# Patient Record
Sex: Male | Born: 1975 | Race: Black or African American | Hispanic: No | Marital: Single | Smoking: Former smoker
Health system: Southern US, Community
[De-identification: ages and names within clinical notes are randomized; demographics above are authoritative.]

## PROBLEM LIST (undated history)

## (undated) DIAGNOSIS — N200 Calculus of kidney: Secondary | ICD-10-CM

---

## 2008-07-21 ENCOUNTER — Emergency Department (HOSPITAL_COMMUNITY): Admission: EM | Admit: 2008-07-21 | Discharge: 2008-07-21 | Payer: Self-pay | Admitting: *Deleted

## 2009-03-11 ENCOUNTER — Ambulatory Visit: Payer: Self-pay | Admitting: Family Medicine

## 2009-03-11 DIAGNOSIS — IMO0002 Reserved for concepts with insufficient information to code with codable children: Secondary | ICD-10-CM | POA: Insufficient documentation

## 2009-03-11 DIAGNOSIS — J309 Allergic rhinitis, unspecified: Secondary | ICD-10-CM | POA: Insufficient documentation

## 2009-03-11 LAB — CONVERTED CEMR LAB
ALT: 22 units/L (ref 0–53)
AST: 23 units/L (ref 0–37)
CO2: 28 meq/L (ref 19–32)
Calcium: 9.7 mg/dL (ref 8.4–10.5)
Chloride: 103 meq/L (ref 96–112)
Cholesterol: 180 mg/dL (ref 0–200)
Lymphocytes Relative: 27 % (ref 12–46)
Lymphs Abs: 1.7 10*3/uL (ref 0.7–4.0)
Monocytes Relative: 8 % (ref 3–12)
Neutro Abs: 3.8 10*3/uL (ref 1.7–7.7)
Neutrophils Relative %: 63 % (ref 43–77)
RBC: 4.66 M/uL (ref 4.22–5.81)
Sodium: 142 meq/L (ref 135–145)
Total Protein: 7.6 g/dL (ref 6.0–8.3)
VLDL: 38 mg/dL (ref 0–40)
Vit D, 25-Hydroxy: 17 ng/mL — ABNORMAL LOW (ref 30–89)
WBC: 6.1 10*3/uL (ref 4.0–10.5)

## 2009-03-14 ENCOUNTER — Telehealth (INDEPENDENT_AMBULATORY_CARE_PROVIDER_SITE_OTHER): Payer: Self-pay | Admitting: Family Medicine

## 2009-03-31 ENCOUNTER — Encounter (INDEPENDENT_AMBULATORY_CARE_PROVIDER_SITE_OTHER): Payer: Self-pay | Admitting: Family Medicine

## 2009-04-15 ENCOUNTER — Encounter: Payer: Self-pay | Admitting: Physician Assistant

## 2009-04-15 ENCOUNTER — Ambulatory Visit: Payer: Self-pay | Admitting: Family Medicine

## 2009-04-15 DIAGNOSIS — E559 Vitamin D deficiency, unspecified: Secondary | ICD-10-CM | POA: Insufficient documentation

## 2009-04-15 DIAGNOSIS — R944 Abnormal results of kidney function studies: Secondary | ICD-10-CM

## 2009-04-15 DIAGNOSIS — Z87442 Personal history of urinary calculi: Secondary | ICD-10-CM

## 2009-04-15 DIAGNOSIS — R809 Proteinuria, unspecified: Secondary | ICD-10-CM

## 2009-04-15 DIAGNOSIS — R319 Hematuria, unspecified: Secondary | ICD-10-CM

## 2009-04-15 LAB — CONVERTED CEMR LAB
BUN: 12 mg/dL (ref 6–23)
Chloride: 103 meq/L (ref 96–112)
Creatinine, Ser: 1.04 mg/dL (ref 0.40–1.50)
Crystals: NONE SEEN
Glucose, Bld: 91 mg/dL (ref 70–99)
RBC / HPF: NONE SEEN (ref <3)
Squamous Epithelial / LPF: NONE SEEN /lpf
Uric Acid, Serum: 6.5 mg/dL (ref 4.0–7.8)

## 2009-04-17 ENCOUNTER — Encounter (INDEPENDENT_AMBULATORY_CARE_PROVIDER_SITE_OTHER): Payer: Self-pay | Admitting: Family Medicine

## 2009-04-17 ENCOUNTER — Ambulatory Visit (HOSPITAL_COMMUNITY): Admission: RE | Admit: 2009-04-17 | Discharge: 2009-04-17 | Payer: Self-pay | Admitting: Family Medicine

## 2009-05-09 ENCOUNTER — Encounter: Payer: Self-pay | Admitting: Physician Assistant

## 2009-06-11 ENCOUNTER — Encounter (INDEPENDENT_AMBULATORY_CARE_PROVIDER_SITE_OTHER): Payer: Self-pay | Admitting: Family Medicine

## 2009-06-11 ENCOUNTER — Ambulatory Visit: Payer: Self-pay | Admitting: Family Medicine

## 2009-06-11 LAB — CONVERTED CEMR LAB
CO2: 28 meq/L (ref 19–32)
Chloride: 101 meq/L (ref 96–112)
Creatinine, Ser: 0.99 mg/dL (ref 0.40–1.50)
Potassium: 4.3 meq/L (ref 3.5–5.3)
Sed Rate: 5 mm/hr (ref 0–16)
Sodium: 139 meq/L (ref 135–145)
TSH: 0.991 microintl units/mL (ref 0.350–4.500)

## 2009-10-10 ENCOUNTER — Ambulatory Visit: Payer: Self-pay | Admitting: Internal Medicine

## 2010-05-26 NOTE — Letter (Signed)
Summary: REFERRAL//RADIOLOGY//APPT DATE & TIME  REFERRAL//RADIOLOGY//APPT DATE & TIME   Imported By: Arta Bruce 05/23/2009 12:49:22  _____________________________________________________________________  External Attachment:    Type:   Image     Comment:   External Document

## 2010-05-26 NOTE — Miscellaneous (Signed)
Summary: PTH Intact Interpretation  PTH-Intact to Total Calcium Ratio:  NORMAL  Clinical Lists Changes

## 2010-12-01 ENCOUNTER — Observation Stay (HOSPITAL_COMMUNITY)
Admission: EM | Admit: 2010-12-01 | Discharge: 2010-12-01 | Disposition: A | Discharge status: Home / Self Care | Payer: Self-pay | Attending: Urology | Admitting: Urology

## 2010-12-01 ENCOUNTER — Emergency Department (HOSPITAL_COMMUNITY): Payer: Self-pay

## 2010-12-01 ENCOUNTER — Emergency Department (HOSPITAL_COMMUNITY)
Admission: EM | Admit: 2010-12-01 | Discharge: 2010-12-01 | Disposition: A | Discharge status: Nursing Home (Includes ICF) | Payer: Self-pay | Attending: Emergency Medicine | Admitting: Emergency Medicine

## 2010-12-01 DIAGNOSIS — N201 Calculus of ureter: Principal | ICD-10-CM | POA: Insufficient documentation

## 2010-12-01 DIAGNOSIS — R112 Nausea with vomiting, unspecified: Secondary | ICD-10-CM | POA: Insufficient documentation

## 2010-12-01 DIAGNOSIS — R109 Unspecified abdominal pain: Secondary | ICD-10-CM | POA: Insufficient documentation

## 2010-12-01 DIAGNOSIS — N200 Calculus of kidney: Secondary | ICD-10-CM | POA: Insufficient documentation

## 2010-12-01 DIAGNOSIS — F17200 Nicotine dependence, unspecified, uncomplicated: Secondary | ICD-10-CM | POA: Insufficient documentation

## 2010-12-01 LAB — DIFFERENTIAL
Basophils Absolute: 0 10*3/uL (ref 0.0–0.1)
Eosinophils Absolute: 0.1 10*3/uL (ref 0.0–0.7)
Eosinophils Relative: 1 % (ref 0–5)
Lymphocytes Relative: 13 % (ref 12–46)
Monocytes Absolute: 0.7 10*3/uL (ref 0.1–1.0)

## 2010-12-01 LAB — URINALYSIS, ROUTINE W REFLEX MICROSCOPIC
Glucose, UA: NEGATIVE mg/dL
Nitrite: NEGATIVE
Specific Gravity, Urine: 1.022 (ref 1.005–1.030)
pH: 5.5 (ref 5.0–8.0)

## 2010-12-01 LAB — CBC
HCT: 40.7 % (ref 39.0–52.0)
MCHC: 35.1 g/dL (ref 30.0–36.0)
MCV: 85 fL (ref 78.0–100.0)
Platelets: 185 10*3/uL (ref 150–400)
RDW: 12.2 % (ref 11.5–15.5)
WBC: 13.7 10*3/uL — ABNORMAL HIGH (ref 4.0–10.5)

## 2010-12-01 LAB — POCT I-STAT, CHEM 8
Calcium, Ion: 1.14 mmol/L (ref 1.12–1.32)
Creatinine, Ser: 1.4 mg/dL — ABNORMAL HIGH (ref 0.50–1.35)
Glucose, Bld: 104 mg/dL — ABNORMAL HIGH (ref 70–99)
HCT: 44 % (ref 39.0–52.0)
Hemoglobin: 15 g/dL (ref 13.0–17.0)

## 2010-12-01 LAB — URINE MICROSCOPIC-ADD ON

## 2010-12-02 NOTE — H&P (Signed)
  Randall Bean, Randall Bean             ACCOUNT NO.:  [id number]  MEDICAL RECORD NO.:  [id number]  LOCATION:                                 FACILITY:  PHYSICIAN:  Excell Seltzer. Annabell Howells, M.D.    DATE OF BIRTH:  01/22/1976  DATE OF ADMISSION: DATE OF DISCHARGE:                             HISTORY & PHYSICAL   CHIEF COMPLAINT:  Left flank pain.  HISTORY:  This is 35 year old Randall Bean's male who presented to the emergency room last night with a 6-hour history of left flank pain with nausea, vomiting, and dark urine.  He has had a prior history of stones. A CT in the emergency room revealed bilateral ureteral stones with an 8- mm left proximal stone with obstruction.  Also, there was a 3-mm stone below that and there was a 5-mm proximal stone on the right without obstruction.  Additionally, there were small bilateral renal stones. His pain has required multiple infusions of Dilaudid to control.  PAST HISTORY:  Pertinent for no drug allergies.CURRENT MEDICATIONS:  None.  MEDICAL HISTORY:  Pertinent for stones.  SURGICAL HISTORY:  Unremarkable.  SOCIAL HISTORY:  He smokes tobacco, using hookah.  He denies alcohol. He works as a Electrical engineer.  FAMILY HISTORY:  Pertinent for heart disease.  REVIEW OF SYSTEMS:  He denies voiding complaints.  He denies fever and chills.  He is otherwise entirely without complaints on a full 16-point review of systems.  PHYSICAL EXAMINATION:  VITAL SIGNS:  Temperature is 98.2, pulse 68, respirations 24, blood pressure is 143/96. GENERAL:  He is a well-developed, well-nourished male in no acute distress.  Alert and oriented x3. HEAD AND FACE: Normocephalic and atraumatic. NECK: Supple without mass. LUNGS: Clear to auscultation. HEART:  Regular rate and rhythm. ABDOMEN: Soft, flat with left CVA tenderness.  No mass, hepatosplenomegaly, or hernias noted.  He has no inguinal adenopathy. EXTREMITIES:  Have full range of motion without edema. SKIN:  Warm and  dry. GU: Exam deferred to cysto.  ACCESSORY CLINICAL INFORMATION:  CT films and report were reviewed.  His urine had 7-10 white cells, too numerous to count red cells.  His creatinine is 1.4.  White count was 13.7 with left shift.  IMPRESSION:  Bilateral ureteral stones with left obstruction with an 8- mm stone, it is unlikely to pass and bilateral renal stones.  PLAN:  At this point, I believe cystoscopy with possible ureteroscopy with insertion of bilateral ureteral stents is indicated.  He will likely need a lithotripsy at least of the left proximal stone if ureteroscopy is not successful.  I have reviewed the risk in detail including bleeding, infection, ureteral injury, need for secondary procedures, need for stents and stent discomfort, need for stent removal at a later date, thrombotic events and anesthetic complications.  He is agreeable to proceed.     Excell Seltzer. Annabell Howells, M.D.     JJW/MEDQ  D:  12/01/2010  T:  12/01/2010  Job:  [id number]  Electronically Signed by Bjorn Pippin M.D. on 12/02/2010 10:29:48 AM

## 2010-12-08 NOTE — Op Note (Signed)
Randall Bean, OSHITA NO.:  [id number]  MEDICAL RECORD NO.:  [id number]  LOCATION:  1444                         FACILITY:  [facility]  PHYSICIAN:  Natalia Leatherwood, MD    DATE OF BIRTH:  02-21-1976  DATE OF PROCEDURE:  12/01/2010 DATE OF DISCHARGE:  12/01/2010                              OPERATIVE REPORT   PREOPERATIVE DIAGNOSES:  Bilateral urolithiasis and nephrolithiasis.  POSTOPERATIVE DIAGNOSES:  Bilateral urolithiasis and nephrolithiasis.  PROCEDURE PERFORMED:  Cystoscopy, bilateral ureteral stent placement.  FINDINGS:  Bilateral ureteral stones.  No bladder lesions.  Particulate materal in urine from an unobstructed urinary system bilaterally.  HISTORY OF PRESENT ILLNESS:  A pleasant 34 year old gentleman who presented to the emergency department with pain.  A CT scan showed bilateral ureteral stones that appeared to be obstructing.  The patient was counseled on options as well as the risks and benefits of the procedure.  The patient wished to proceed.  PROCEDURE:  After informed consent was obtained, the patient was taken to the operating room, was placed in supine position, IV antibiotics transfusion, general anesthesia was induced.  He was then placed in dorsal lithotomy position where all pertinent neurovascular pressure points were padded appropriately.  His genitals were then prepared and draped in usual sterile fashion.  Following this, a 30-degree cystoscope was advanced through the urethra and into the bladder.  The bladder was evaluated in systematic fashion.  There was no lesions noted in the bladder. Attention was then turned to the left ureteral orifice.  It was cannulated with a sensor-tip wire under fluoroscopy.  Once the wire was curled in the left renal pelvis, there was a large amount of debris and particulate matter emanating from around the wire that had been obstructed by the stone.  Given this, I felt that it was more appropriate  to place a stent to allow the debris to pass and come back at another time to operate on the stone.  Therefore, a double-J 6 x 28 ureteral stent was placed over the wire with a fluoroscopy.  There was a good curl in the left renal pelvis and in the bladder.  The strings were not left in place on the stent.  Attention was then turned to the right ureteral orifice.  This was cannulated with a sensor-tip wire, which was placed easily under fluoroscopy into the right renal pelvis.  Again, there was particulate matter emanating from around the stone once the wire was placed.  I felt that operating further could cause the patient to become septic.  Therefore, I elected to place a ureteral stent.  I placed a 6 x 28 double-J ureteral stent over the wire with a good curl noted in the renal pelvis and no strings on the stent.  The bladder was then drained and 10 mL of lidocaine gel was placed into the urethra and a B and O suppository was given to the patient.  He was placed back in supine position.  Anesthesia was reversed and he was taken to PACU in stable condition.  If the patient tolerates the procedure well, he can be discharged home today.  He will be given antibiotics to cover for the instrumentation.  I will have him return to see me to schedule his ureteroscopy in the future.          ______________________________ Natalia Leatherwood, MD     DW/MEDQ  D:  12/01/2010  T:  12/02/2010  Job:  [id number]  Electronically Signed by Natalia Leatherwood MD on 12/08/2010 06:19:52 PM

## 2010-12-08 NOTE — Discharge Summary (Signed)
  NAMEAMRIT, CRESS NO.:  [id number]  MEDICAL RECORD NO.:  [id number]  LOCATION:                                 FACILITY:  PHYSICIAN:  Natalia Leatherwood, MD    DATE OF BIRTH:  12/28/1975  DATE OF ADMISSION:  11/30/2010 DATE OF DISCHARGE:  12/01/2010                              DISCHARGE SUMMARY   DISCHARGE PHYSICIAN:  Natalia Leatherwood, MD, [facility].  ADMISSION DIAGNOSIS:  Bilateral urolithiasis.  DISCHARGE DIAGNOSIS:  Bilateral urolithiasis.  PROCEDURES PERFORMED:  On December 01, 2010, the patient had a cystoscopy with bilateral ureteral stent placement.  HISTORY OF PRESENT ILLNESS:  This pleasant patient presented for bilateral obstructing urolithiasis.  He was admitted to the hospital for pain control and IV hydration.  HOSPITAL COURSE:  The patient was admitted from the Emergency Department.  He was given his treatment options regarding the obstructing stones.  He elected for attempted ureteroscopy and stent placement.  He went to the operating room on August 7th.  Because of return of heavy sediment concerning for purulent drainage after placing guidewires up to the bilateral kidneys it was felt that further manipulation of stones would result in sepsis, therefore ureteral stents were placed bilaterally.  The patient was admitted back to the floor and tolerated the procedure well.  He did not spike any fevers following the surgery.  He felt that he could be discharged to home, we agreed.  DISCHARGE DISPOSITION:  To home.  DISCHARGE CONDITION:  Stable.  DISCHARGE DIET:  Regular.  DISCHARGE MEDICATIONS:  He was given ciprofloxacin 500 mg p.o. b.i.d. for 3 days.  He was also given oxycodone/acetaminophen 5/325 mg tablet to take 1-2 tablet every 4 hours as needed for pain.  FOLLOWUP:  He will be contacted by [facility] for followup in 2-3 weeks for definitive ureteroscopy.  He was given discharge instructions regarding not driving  while taking narcotic pain medications, not operating heavy machinery during that time.  Obvious questions were answered.          ______________________________ Natalia Leatherwood, MD     DW/MEDQ  D:  12/01/2010  T:  12/02/2010  Job:  [id number]  Electronically Signed by Natalia Leatherwood MD on 12/08/2010 06:16:41 PM

## 2010-12-17 ENCOUNTER — Other Ambulatory Visit: Payer: Self-pay | Admitting: Urology

## 2010-12-17 ENCOUNTER — Encounter (HOSPITAL_COMMUNITY): Payer: Self-pay

## 2010-12-17 LAB — CBC
HCT: 40.7 % (ref 39.0–52.0)
MCHC: 33.9 g/dL (ref 30.0–36.0)
MCV: 87.5 fL (ref 78.0–100.0)
Platelets: 193 10*3/uL (ref 150–400)
RDW: 12.7 % (ref 11.5–15.5)
WBC: 6.4 10*3/uL (ref 4.0–10.5)

## 2010-12-18 LAB — URINE CULTURE: Colony Count: 8000

## 2010-12-23 ENCOUNTER — Ambulatory Visit (HOSPITAL_COMMUNITY)
Admission: RE | Admit: 2010-12-23 | Discharge: 2010-12-23 | Disposition: A | Discharge status: Home / Self Care | Payer: Self-pay | Source: Ambulatory Visit | Attending: Urology | Admitting: Urology

## 2010-12-23 DIAGNOSIS — N200 Calculus of kidney: Secondary | ICD-10-CM | POA: Insufficient documentation

## 2010-12-23 DIAGNOSIS — N201 Calculus of ureter: Secondary | ICD-10-CM | POA: Insufficient documentation

## 2010-12-29 NOTE — Op Note (Signed)
Randall Bean, OCH NO.:  [id number]  MEDICAL RECORD NO.:  [id number]  LOCATION:  DAYL                         FACILITY:  [facility]  PHYSICIAN:  Natalia Leatherwood, MD    DATE OF BIRTH:  June 20, 1975  DATE OF PROCEDURE:  12/23/2010 DATE OF DISCHARGE:  12/23/2010                              OPERATIVE REPORT   OPERATING PHYSICIAN:  Natalia Leatherwood, MD  PREOPERATIVE DIAGNOSIS:  Bilateral urolithiasis.  POSTOPERATIVE DIAGNOSIS:  Bilateral urolithiasis.  PROCEDURE PERFORMED:  Cystoscopy, bilateral ureteroscopy, bilateral laser lithotripsy, bilateral ureteral stent exchange.  COMPLICATIONS:  None.  ESTIMATED BLOOD LOSS:  Minimal.  FINDINGS:  Bilateral obstructing ureteral stones and also renal stones bilaterally.  SPECIMEN:  Stones were removed and discarded.  HISTORY OF PRESENT ILLNESS:  This is a pleasant 35 year old gentleman who presented to the emergency department in pain due to bilateralobstructing stones.  He was admitted and taken to the operating room where ureteral stents were placed.  He presents today for interval removal of stones by ureteroscopy.  PROCEDURE IN DETAIL:  After informed consent was obtained, the patient was taken to the operating room, he was placed in supine position after the antibiotics were infused and general anesthesia was induced.  He was then placed in dorsal lithotomy position, genitals were prepped and draped  in usual sterile fashion.  All pertinent neurovascular pressure points were padded appropriately.  Following this, a 12-French cystoscope was advanced through the urethra into the bladder.  The right ureteral orifice was identified and the right ureteral stent was emanating from this.  This was removed with stent grasper and then 2 Sensor tip wires were placed up the right ureter into the right renal pelvis with good curl on fluoroscopy.  One wire was secured as a safety wire and that was used as a working wire.  A  ureteral access sheath was then placed over the working wire under fluoroscopy with ease.  The obturator and wire removed and then flexible ureteroscope was placed up into the ureter.  There was noted to be an obstructing stone in the proximal ureter and 200 micron holmium laser was used to break the stone apart using a setting of 0.5 joules and 20 Hz. Before lithotripsy was performed, the Backstop Gel was placed beyond the stone, however, the stone did fall back into the renal pelvis and this had to be removed.  After this was done, all the pieces were removed with basket stone retrieval. After this was completed and all the large stone fragments were removed, the ureteroscope was removed and the entire length of the ureter was visualized; there were no ureteral injuries noted.  After this, the cystoscope was used to place a stent over the safety wire.  The stent was a 6 x 26 double-J ureteral stent.  There was good curl on fluoroscopy on the right renal pelvis and in the bladder.    Attention was then turned to left ureteral orifice. The stent was grasped and removed and 2 Sensor tip wires were placed up the left ureter into the renal pelvis under good fluoroscopy.  After this was done, again a ureteral access sheath was placed over the working wire, where the  other wire was secured as a safety wire.  This was placed in the mid ureter and the obturator and working wire removed. A flexible uteroscope was passed up through the access sheath into the ureter where there were 2 stones in the proximal ureter.  The Backstop Gel was placed again.  This was a second amount of Backstop Gel, so 2 packages were opened.  After this was done, the lithotripsy was carried out with a 200 micron holmium laser with a setting of 0.2 joules and 40 Hz. After this was done, all the pieces were cleared with basket retrieval and some stones in the renal pelvis and renal collecting system which were also  removed.  Once all large stone fragments had been cleared, the ureteral access sheath was removed and the entire length of the ureter was visualized.  There were no lesions or lacerations to the ureter.  A double-J 6 x 26 ureteral stent was placed up over the safety wire through a cystoscope.  This completed the procedure as bladder was emptied and B and O suppository was placed into his rectum.  He was then placed back in supine position. IV anesthetic was reversed and the patient was taken to PACU in stable condition.          ______________________________ Natalia Leatherwood, MD     DW/MEDQ  D:  12/23/2010  T:  12/23/2010  Job:  [id number]  Electronically Signed by Natalia Leatherwood MD on 12/29/2010 07:28:09 PM

## 2011-09-23 ENCOUNTER — Ambulatory Visit: Payer: PRIVATE HEALTH INSURANCE

## 2011-10-08 ENCOUNTER — Ambulatory Visit (INDEPENDENT_AMBULATORY_CARE_PROVIDER_SITE_OTHER): Payer: PRIVATE HEALTH INSURANCE | Admitting: Emergency Medicine

## 2011-10-08 VITALS — BP 123/82 | HR 58 | Temp 98.1°F | Resp 16 | Ht 71.0 in | Wt 215.0 lb

## 2011-10-08 DIAGNOSIS — K2100 Gastro-esophageal reflux disease with esophagitis, without bleeding: Secondary | ICD-10-CM

## 2011-10-08 MED ORDER — SUCRALFATE 1 G PO TABS: 1.0000 g | ORAL_TABLET | Freq: Four times a day (QID) | ORAL | Status: DC

## 2011-10-08 MED ORDER — LANSOPRAZOLE 30 MG PO CPDR: 30.0000 mg | DELAYED_RELEASE_CAPSULE | Freq: Every day | ORAL | Status: DC

## 2011-10-08 NOTE — Patient Instructions (Signed)

## 2011-10-08 NOTE — Progress Notes (Signed)
  Subjective:    Patient ID: Randall Bean, male    DOB: 08-Dec-1975, 36 y.o.   MRN: [id number]  Gastrophageal Reflux He complains of belching, choking, heartburn and water brash. He reports no abdominal pain, no chest pain, no coughing, no dysphagia, no early satiety, no globus sensation, no hoarse voice, no nausea, no sore throat, no stridor, no tooth decay or no wheezing. This is a new problem. The current episode started 1 to 4 weeks ago. The problem occurs constantly. The problem has been unchanged. The heartburn duration is an hour. The heartburn is located in the substernum. The heartburn is of mild intensity. The heartburn wakes him from sleep. The heartburn does not limit his activity. The heartburn changes with position. The symptoms are aggravated by lying down, certain foods and bending. Pertinent negatives include no anemia, fatigue, melena, muscle weakness, orthopnea or weight loss. Risk factors include caffeine use and smoking/tobacco exposure. He has tried nothing for the symptoms. Past procedures do not include an abdominal ultrasound, an EGD, esophageal manometry, esophageal pH monitoring, H. pylori antibody titer or a UGI. Past invasive treatments do not include gastroplasty, gastroplication or reflux surgery.      Review of Systems  Constitutional: Negative for weight loss and fatigue.  HENT: Negative.  Negative for sore throat and hoarse voice.   Eyes: Negative.   Respiratory: Positive for choking. Negative for cough and wheezing.   Cardiovascular: Negative for chest pain.  Gastrointestinal: Positive for heartburn. Negative for dysphagia, nausea, abdominal pain, diarrhea, constipation and melena.  Genitourinary: Negative.   Musculoskeletal: Negative.  Negative for muscle weakness.       Objective:   Physical Exam  Constitutional: He is oriented to person, place, and time. He appears well-developed and well-nourished.  HENT:  Head: Normocephalic and atraumatic.  Eyes:  Conjunctivae are normal. Pupils are equal, round, and reactive to light.  Neck: Normal range of motion. Neck supple.  Cardiovascular: Normal rate and regular rhythm.   Pulmonary/Chest: Effort normal and breath sounds normal.  Abdominal: Soft. There is no tenderness. There is no rebound and no guarding.  Musculoskeletal: Normal range of motion.  Neurological: He is alert and oriented to person, place, and time.  Skin: Skin is warm and dry.          Assessment & Plan:  Will cut out using his hookah and decrease tea intake.  Follow up in one month for recheck

## 2011-11-02 ENCOUNTER — Telehealth: Payer: Self-pay

## 2011-11-02 NOTE — Telephone Encounter (Signed)
The patient called regarding a medication that Dr. Dareen Piano has Rx'd for him in June 2013.  Please call patient regarding his medication question at [phone].

## 2011-11-02 NOTE — Telephone Encounter (Signed)
Spoke with patient-- he wasn't sure whether he was needing to recheck with Dr. Dareen Piano re: his meds.  Chart states to recheck in 1 month from OV--patient understands and will recheck Thursday.  He will also discuss questions about when to take his medications while fasting.

## 2011-11-04 ENCOUNTER — Ambulatory Visit (INDEPENDENT_AMBULATORY_CARE_PROVIDER_SITE_OTHER): Payer: PRIVATE HEALTH INSURANCE | Admitting: Emergency Medicine

## 2011-11-04 VITALS — BP 116/74 | HR 55 | Temp 98.1°F | Resp 16 | Ht 73.5 in | Wt 209.0 lb

## 2011-11-04 DIAGNOSIS — R1310 Dysphagia, unspecified: Secondary | ICD-10-CM

## 2011-11-04 NOTE — Progress Notes (Signed)
   Date:  11/04/2011   Name:  Randall Bean   DOB:  August 12, 1975   MRN:  [id number]  PCP:  Syliva Overman, MD    Chief Complaint: Gastrophageal Reflux   History of Present Illness:  Randall Bean is a 36 y.o. very pleasant male patient who presents with the following:  Treated for GERD with prevacid and carafate.  Says that he has not improved and is still having a sensation of foreign body in throat.  Able to swallow without difficulty.  No nausea or vomiting, cough, fever or chills, other complaint  Patient Active Problem List  Diagnosis  . VITAMIN D DEFICIENCY  . ALLERGIC RHINITIS CAUSE UNSPECIFIED  . HEMATURIA UNSPECIFIED  . BACK PAIN WITH RADICULOPATHY  . PROTEINURIA, MILD  . NONSPECIFIC ABNORM RESULTS KIDNEY FUNCTION STUDY  . NEPHROLITHIASIS, HX OF   No past medical history on file. No past surgical history on file. History  Substance Use Topics  . Smoking status: Former Smoker    Quit date: 10/05/2011  . Smokeless tobacco: Not on file  . Alcohol Use: Not on file   No family history on file. No Known Allergies  Medication list has been reviewed and updated.  Current Outpatient Prescriptions on File Prior to Visit  Medication Sig Dispense Refill  . lansoprazole (PREVACID) 30 MG capsule Take 1 capsule (30 mg total) by mouth daily.  30 capsule  3  . sucralfate (CARAFATE) 1 G tablet Take 1 tablet (1 g total) by mouth 4 (four) times daily. 1 hour Ac and hs  120 tablet  2  . calcium carbonate 200 MG capsule Take 110 mg by mouth daily.        Review of Systems:  As per HPI, otherwise negative.    Physical Examination: Filed Vitals:   11/04/11 1839  BP: 116/74  Pulse: 55  Temp: 98.1 F (36.7 C)  Resp: 16   Filed Vitals:   11/04/11 1839  Height: 6' 1.5" (1.867 m)  Weight: 209 lb (94.802 kg)   Body mass index is 27.20 kg/(m^2). Ideal Body Weight: Weight in (lb) to have BMI = 25: 191.7    GEN: WDWN, NAD, Non-toxic, Alert & Oriented x 3 HEENT:  Atraumatic, Normocephalic.  Ears and Nose: No external deformity. EXTR: No clubbing/cyanosis/edema NEURO: Normal gait.  PSYCH: Normally interactive. Conversant. Not depressed or anxious appearing.  Calm demeanor.  Chest CTA  EKG / Labs / Xrays: None available at time of encounter  Assessment and Plan: Dysphagia Refer to GI  Carmelina Dane, MD

## 2011-11-20 ENCOUNTER — Ambulatory Visit (INDEPENDENT_AMBULATORY_CARE_PROVIDER_SITE_OTHER): Payer: PRIVATE HEALTH INSURANCE | Admitting: Emergency Medicine

## 2011-11-20 VITALS — BP 119/78 | HR 60 | Temp 98.5°F | Resp 14 | Ht 73.75 in | Wt 204.6 lb

## 2011-11-20 DIAGNOSIS — A6000 Herpesviral infection of urogenital system, unspecified: Secondary | ICD-10-CM

## 2011-11-20 MED ORDER — VALACYCLOVIR HCL 1 G PO TABS: 1000.0000 mg | ORAL_TABLET | Freq: Every day | ORAL | Status: AC

## 2011-11-20 NOTE — Patient Instructions (Signed)
Genital Herpes Genital herpes is a sexually transmitted disease. This means that it is a disease passed by having sex with an infected person. There is no cure for genital herpes. The time between attacks can be months to years. The virus may live in a person but produce no problems (symptoms). This infection can be passed to a baby as it travels down the birth canal (vagina). In a newborn, this can cause central nervous system damage, eye damage, or even death. The virus that causes genital herpes is usually HSV-2 virus. The virus that causes oral herpes is usually HSV-1. The diagnosis (learning what is wrong) is made through culture results. SYMPTOMS  Usually symptoms of pain and itching begin a few days to a week after contact. It first appears as small blisters that progress to small painful ulcers which then scab over and heal after several days. It affects the outer genitalia, birth canal, cervix, penis, anal area, buttocks, and thighs. HOME CARE INSTRUCTIONS   Keep ulcerated areas dry and clean.   Take medications as directed. Antiviral medications can speed up healing. They will not prevent recurrences or cure this infection. These medications can also be taken for suppression if there are frequent recurrences.   While the infection is active, it is contagious. Avoid all sexual contact during active infections.   Condoms may help prevent spread of the herpes virus.   Practice safe sex.   Wash your hands thoroughly after touching the genital area.   Avoid touching your eyes after touching your genital area.   Inform your caregiver if you have had genital herpes and become pregnant. It is your responsibility to insure a safe outcome for your baby in this pregnancy.   Only take over-the-counter or prescription medicines for pain, discomfort, or fever as directed by your caregiver.  SEEK MEDICAL CARE IF:   You have a recurrence of this infection.   You do not respond to medications and  are not improving.   You have new sources of pain or discharge which have changed from the original infection.   You have an oral temperature above 102 F (38.9 C).   You develop abdominal pain.   You develop eye pain or signs of eye infection.  Document Released: 04/09/2000 Document Revised: 04/01/2011 Document Reviewed: 04/30/2009 [employer] Patient Information 2012 [employer]. 

## 2011-11-20 NOTE — Progress Notes (Signed)
  Subjective:    Patient ID: Randall Bean, male    DOB: [birth year], 36 y.o.   MRN: [id number]  Testicle Pain The patient's primary symptoms include genital lesions and testicular pain. The patient's pertinent negatives include no genital injury, genital itching, pelvic pain, penile discharge, penile pain, priapism or scrotal swelling. This is a new problem. The current episode started yesterday. The problem occurs constantly. The problem has been unchanged. The pain is medium. Associated symptoms include a fever and a rash. Pertinent negatives include no abdominal pain, anorexia, chest pain, chills, constipation, coughing, discolored urine, dysuria, flank pain, frequency, headaches, hematuria, hesitancy, nausea, painful intercourse, shortness of breath, sore throat or urgency. The genital lesion's characteristics include rash and vesicle. Nothing aggravates the symptoms. He has tried nothing for the symptoms. He is not sexually active. There is no history of BPH, chlamydia, cryptorchidism, erectile aid use, erectile dysfunction, a femoral hernia, gonorrhea, herpes simplex, HIV, an inguinal hernia, kidney stones, prostatitis, sickle cell disease, syphilis or varicocele.      Review of Systems  Constitutional: Positive for fever. Negative for chills.  HENT: Negative.  Negative for sore throat.   Eyes: Negative.   Respiratory: Negative for cough and shortness of breath.   Cardiovascular: Negative for chest pain.  Gastrointestinal: Negative for nausea, abdominal pain, constipation and anorexia.  Genitourinary: Positive for testicular pain. Negative for dysuria, hesitancy, urgency, frequency, flank pain, discharge, scrotal swelling, penile pain and pelvic pain.  Skin: Positive for rash.  Neurological: Negative for headaches.       Objective:   Physical Exam  Constitutional: He appears well-developed and well-nourished.  HENT:  Head: Normocephalic and atraumatic.  Eyes: Pupils are equal,  round, and reactive to light.  Neck: Normal range of motion. Neck supple.  Cardiovascular: Normal rate.   Pulmonary/Chest: Effort normal.  Abdominal: Soft.  Genitourinary: Penile tenderness (vesicluar eruption on buttocks and scrotum and base of penis) present.  Musculoskeletal: Normal range of motion.  Neurological: He is alert.  Skin: Skin is warm and dry.          Assessment & Plan:  Not sexually active for one year and says he was in a relationship at that time  Culture valtrex

## 2011-11-24 LAB — HERPES SIMPLEX VIRUS CULTURE: Organism ID, Bacteria: NOT DETECTED

## 2011-11-25 ENCOUNTER — Encounter: Payer: Self-pay | Admitting: Emergency Medicine

## 2011-12-07 ENCOUNTER — Telehealth: Payer: Self-pay

## 2011-12-07 NOTE — Telephone Encounter (Signed)
Linda from Referrals checked on appt w/Dr Loreta Ave. She reported back to me that he had one sch on 7/22 but missed appt (may have been language barrier?). Linda rescheduled appt for tomorrow 12/08/11 at 8:30 and will let pt know about appt.

## 2011-12-07 NOTE — Telephone Encounter (Signed)
Pt reports that the Prevacid is not helping at all and so he really doesn't think he needs a RF of it, but would like to see that specialist ASAP. He states his GERD issues/feeling in throat are just the same as at [facility], no better. Explained that Dr Dareen Piano had started a referral to a specialist and I will have our Referrals depart check on when he can get an appt. Called and LM on Referrals VM to please check on this and will call them back again to f/up.

## 2011-12-07 NOTE — Telephone Encounter (Signed)
Patient is not sure if he needs refill for Lansoprazole. May also need referral. Still having issues. [facility] [address]

## 2011-12-10 ENCOUNTER — Telehealth: Payer: Self-pay

## 2011-12-10 NOTE — Telephone Encounter (Signed)
Attempted to call patient, no one answered will try again later

## 2011-12-10 NOTE — Telephone Encounter (Signed)
I have called patient to advise.  

## 2011-12-10 NOTE — Telephone Encounter (Signed)
Dr Dareen Piano do you want patient to continue suppressive therapy?

## 2011-12-10 NOTE — Telephone Encounter (Signed)
Pt was seen for ? Herpes virus but his culture was negative he received report in the mail,patient wants to know if he needs to continue the meds, Valtrex, or if he needs something else. He is better with the rash he uses [facility] [address]

## 2011-12-10 NOTE — Telephone Encounter (Signed)
PT STATES HE SEES DR Dareen Piano AND WOULD LIKE TO SPEAK WITH HIS DR REGARDING HIS DIAGNOSIS PLEASE CALL [phone]

## 2011-12-10 NOTE — Telephone Encounter (Signed)
Viral studies negative.   Can discontinue antiviral.

## 2014-01-10 ENCOUNTER — Ambulatory Visit: Payer: PRIVATE HEALTH INSURANCE | Admitting: Family Medicine

## 2019-05-18 ENCOUNTER — Other Ambulatory Visit: Payer: Self-pay

## 2020-11-21 ENCOUNTER — Emergency Department (HOSPITAL_COMMUNITY): Payer: Self-pay

## 2020-11-21 ENCOUNTER — Emergency Department (HOSPITAL_COMMUNITY)
Admission: EM | Admit: 2020-11-21 | Discharge: 2020-11-21 | Disposition: A | Discharge status: Home / Self Care | Payer: Self-pay | Attending: Emergency Medicine | Admitting: Emergency Medicine

## 2020-11-21 ENCOUNTER — Encounter (HOSPITAL_COMMUNITY): Payer: Self-pay

## 2020-11-21 ENCOUNTER — Other Ambulatory Visit: Payer: Self-pay

## 2020-11-21 DIAGNOSIS — Z87891 Personal history of nicotine dependence: Secondary | ICD-10-CM | POA: Insufficient documentation

## 2020-11-21 DIAGNOSIS — N200 Calculus of kidney: Secondary | ICD-10-CM | POA: Insufficient documentation

## 2020-11-21 HISTORY — DX: Calculus of kidney: N20.0

## 2020-11-21 LAB — COMPREHENSIVE METABOLIC PANEL
ALT: 30 U/L (ref 0–44)
AST: 31 U/L (ref 15–41)
Albumin: 4.3 g/dL (ref 3.5–5.0)
Alkaline Phosphatase: 58 U/L (ref 38–126)
Anion gap: 7 (ref 5–15)
BUN: 12 mg/dL (ref 6–20)
CO2: 26 mmol/L (ref 22–32)
Calcium: 9.6 mg/dL (ref 8.9–10.3)
Chloride: 104 mmol/L (ref 98–111)
Creatinine, Ser: 1.21 mg/dL (ref 0.61–1.24)
GFR, Estimated: >60 mL/min (ref >60)
Glucose, Bld: 116 mg/dL — ABNORMAL HIGH (ref 70–99)
Potassium: 3.9 mmol/L (ref 3.5–5.1)
Sodium: 137 mmol/L (ref 135–145)
Total Bilirubin: 0.8 mg/dL (ref 0.3–1.2)
Total Protein: 8 g/dL (ref 6.5–8.1)

## 2020-11-21 LAB — CBC WITH DIFFERENTIAL/PLATELET
Abs Immature Granulocytes: 0.03 10*3/uL (ref 0.00–0.07)
Basophils Absolute: 0 10*3/uL (ref 0.0–0.1)
Basophils Relative: 0 %
Eosinophils Absolute: 0.2 10*3/uL (ref 0.0–0.5)
Eosinophils Relative: 2 %
HCT: 40.6 % (ref 39.0–52.0)
Hemoglobin: 13.5 g/dL (ref 13.0–17.0)
Immature Granulocytes: 0 %
Lymphocytes Relative: 14 %
Lymphs Abs: 1.1 10*3/uL (ref 0.7–4.0)
MCH: 29.2 pg (ref 26.0–34.0)
MCHC: 33.3 g/dL (ref 30.0–36.0)
MCV: 87.9 fL (ref 80.0–100.0)
Monocytes Absolute: 0.7 10*3/uL (ref 0.1–1.0)
Monocytes Relative: 9 %
Neutro Abs: 5.8 10*3/uL (ref 1.7–7.7)
Neutrophils Relative %: 75 %
Platelets: 220 10*3/uL (ref 150–400)
RBC: 4.62 MIL/uL (ref 4.22–5.81)
RDW: 12.3 % (ref 11.5–15.5)
WBC: 7.8 10*3/uL (ref 4.0–10.5)
nRBC: 0 % (ref 0.0–0.2)

## 2020-11-21 LAB — URINALYSIS, ROUTINE W REFLEX MICROSCOPIC
Bacteria, UA: NONE SEEN
Bilirubin Urine: NEGATIVE
Glucose, UA: NEGATIVE mg/dL
Ketones, ur: NEGATIVE mg/dL
Leukocytes,Ua: NEGATIVE
Nitrite: NEGATIVE
Protein, ur: NEGATIVE mg/dL
Specific Gravity, Urine: 1.006 (ref 1.005–1.030)
pH: 6 (ref 5.0–8.0)

## 2020-11-21 LAB — LIPASE, BLOOD: Lipase: 32 U/L (ref 11–51)

## 2020-11-21 MED ORDER — TAMSULOSIN HCL 0.4 MG PO CAPS: 0.4000 mg | ORAL_CAPSULE | Freq: Every day | ORAL | 0 refills | Status: AC

## 2020-11-21 MED ORDER — KETOROLAC TROMETHAMINE 30 MG/ML IJ SOLN
30.0000 mg | Freq: Once | INTRAMUSCULAR | Status: AC
  Administered 2020-11-21: 30 mg via INTRAMUSCULAR
  Filled 2020-11-21: qty 1

## 2020-11-21 MED ORDER — TAMSULOSIN HCL 0.4 MG PO CAPS: 0.4000 mg | ORAL_CAPSULE | Freq: Every day | ORAL | 0 refills | Status: DC

## 2020-11-21 MED ORDER — OXYCODONE-ACETAMINOPHEN 5-325 MG PO TABS
1.0000 | ORAL_TABLET | Freq: Once | ORAL | Status: AC
Start: 2020-11-21 — End: 2020-11-21
  Administered 2020-11-21: 1 via ORAL
  Filled 2020-11-21: qty 1

## 2020-11-21 MED ORDER — ONDANSETRON 4 MG PO TBDP
4.0000 mg | ORAL_TABLET | Freq: Once | ORAL | Status: AC
  Administered 2020-11-21: 4 mg via ORAL
  Filled 2020-11-21: qty 1

## 2020-11-21 MED ORDER — MORPHINE SULFATE (PF) 4 MG/ML IV SOLN: 4.0000 mg | Freq: Once | INTRAVENOUS | Status: DC

## 2020-11-21 MED ORDER — KETOROLAC TROMETHAMINE 10 MG PO TABS: 10.0000 mg | ORAL_TABLET | Freq: Three times a day (TID) | ORAL | 0 refills | Status: AC | PRN

## 2020-11-21 MED ORDER — KETOROLAC TROMETHAMINE 10 MG PO TABS: 10.0000 mg | ORAL_TABLET | Freq: Three times a day (TID) | ORAL | 0 refills | Status: DC | PRN

## 2020-11-21 MED ORDER — KETOROLAC TROMETHAMINE 15 MG/ML IJ SOLN: 15.0000 mg | Freq: Once | INTRAMUSCULAR | Status: DC

## 2020-11-21 NOTE — ED Provider Notes (Signed)
[facility] EMERGENCY DEPARTMENT Provider Note   CSN: [id number] Arrival date & time: 11/21/20  1200     History Chief Complaint  Patient presents with   Flank Pain    Randall Bean is a 45 y.o. male.  Presents with 2 days of left-sided flank pain described as sharp and aching and intermittent.  Denies any fevers or cough.  The pain is otherwise nonradiating.  No vomiting or diarrhea reported.  He states he has a history of kidney stones and states it feels very similar last kidney stone he states was over 5 years ago.      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 No pertinent surgical history.     No family history on file.  Social History   Tobacco Use   Smoking status: Former    Types: Cigarettes    Quit date: 10/05/2011    Years since quitting: 9.1    Home Medications Prior to Admission medications   Medication Sig Start Date End Date Taking? Authorizing Provider  ketorolac (TORADOL) 10 MG tablet Take 1 tablet (10 mg total) by mouth every 8 (eight) hours as needed for up to 10 doses. 11/21/20  Yes Cheryll Cockayne, MD  tamsulosin (FLOMAX) 0.4 MG CAPS capsule Take 1 capsule (0.4 mg total) by mouth daily after supper for 7 days. 11/21/20 11/28/20 Yes Cheryll Cockayne, MD  calcium carbonate 200 MG capsule Take 110 mg by mouth daily.    [provider]  lansoprazole (PREVACID) 30 MG capsule Take 1 capsule (30 mg total) by mouth daily. 10/08/11 10/07/12  Carmelina Dane, MD  sucralfate (CARAFATE) 1 G tablet Take 1 tablet (1 g total) by mouth 4 (four) times daily. 1  hour Ac and hs 10/08/11 10/07/12  Carmelina Dane, MD    Allergies    Patient has no known allergies.  Review of Systems   Review of Systems  Constitutional:  Negative for fever.  HENT:  Negative for ear pain and sore throat.   Eyes:  Negative for pain.  Respiratory:  Negative for cough.   Cardiovascular:  Negative for chest pain.  Gastrointestinal:  Negative for abdominal pain.  Genitourinary:  Positive for flank pain.  Musculoskeletal:  Negative for back pain.  Skin:  Negative for color change and rash.  Neurological:  Negative for syncope.  All other systems reviewed and are negative.  Physical Exam Updated Vital Signs BP (!) 150/106   Pulse (!) 54   Temp 97.9 F (36.6 C) (Oral)   Resp 16   Ht 6\' 1"  (1.854 m)   Wt 108 kg   SpO2 100%   BMI 31.40 kg/m   Physical Exam Constitutional:      Appearance: He is well-developed.  HENT:     Head: Normocephalic.     Nose: Nose normal.  Eyes:     Extraocular Movements: Extraocular movements intact.  Cardiovascular:     Rate and Rhythm: Normal rate.  Pulmonary:     Effort: Pulmonary effort is normal.  Skin:    Coloration: Skin is not jaundiced.  Neurological:  Mental Status: He is alert. Mental status is at baseline.    ED Results / Procedures / Treatments   Labs (all labs ordered are listed, but only abnormal results are displayed) Labs Reviewed  COMPREHENSIVE METABOLIC PANEL - Abnormal; Notable for the following components:      Result Value   Glucose, Bld 116 (*)    All other components within normal limits  URINALYSIS, ROUTINE W REFLEX MICROSCOPIC - Abnormal; Notable for the following components:   Color, Urine STRAW (*)    Hgb urine dipstick MODERATE (*)    All other components within normal limits  CBC WITH DIFFERENTIAL/PLATELET  LIPASE, BLOOD    EKG None  Radiology CT Renal Stone Study  Result Date: 11/21/2020 CLINICAL DATA:  Flank pain, left-sided, history of renal stones. EXAM: CT ABDOMEN  AND PELVIS WITHOUT CONTRAST TECHNIQUE: Multidetector CT imaging of the abdomen and pelvis was performed following the standard protocol without IV contrast. COMPARISON:  December 01, 2010 FINDINGS: Lower chest: No acute abnormality. Hepatobiliary: Unremarkable noncontrast appearance of the hepatic parenchyma. Gallbladder is unremarkable. No biliary ductal dilation. Pancreas: Within normal limits. Spleen: Within normal limits. Adrenals/Urinary Tract: Bilateral adrenal glands are unremarkable. Edematous appearance of the left kidney with perinephric stranding and mild left hydroureteronephrosis to the level of a 3 mm stone in the distal ureter on image 89/3. Additional nonobstructive bilateral renal stones measuring up to 6 mm mm on the left and 4 mm on the right. No right-sided hydronephrosis. Urinary bladder is unremarkable for degree of distension. Stomach/Bowel: Stomach is within normal limits. Appendix appears normal. No evidence of bowel wall thickening, distention, or inflammatory changes. Vascular/Lymphatic: No abdominal aortic aneurysm. No pathologically enlarged abdominal or pelvic lymph nodes. Reproductive: Prostate is unremarkable. Other: No abdominopelvic ascites. Musculoskeletal: No acute or significant osseous findings. IMPRESSION: 1. Obstructing 3 mm stone in the distal left ureter with associated mild hydroureteronephrosis. 2. Additional nonobstructive bilateral renal stones measuring up to 6 mm on the left and 4 mm on the right. Electronically Signed   By: Maudry Mayhew MD   On: 11/21/2020 14:21    Procedures Procedures   Medications Ordered in ED Medications  ketorolac (TORADOL) 30 MG/ML injection 30 mg (has no administration in time range)  oxyCODONE-acetaminophen (PERCOCET/ROXICET) 5-325 MG per tablet 1 tablet (1 tablet Oral Given 11/21/20 1237)  ondansetron (ZOFRAN-ODT) disintegrating tablet 4 mg (4 mg Oral Given 11/21/20 1237)    ED Course  I have reviewed the triage vital signs and the  nursing notes.  Pertinent labs & imaging results that were available during my care of the patient were reviewed by me and considered in my medical decision making (see chart for details).    MDM Rules/Calculators/A&P                           Patient has obstructing left-sided kidney stone approximately 3 mm.  His pain appears well controlled at this time.  Remainder of his labs unremarkable creatinine is normal.  Given additional Toradol here, given follow-up information with urology.  Advised immediate return if he has fevers worsening pain or any additional concerns.  Final Clinical Impression(s) / ED Diagnoses Final diagnoses:  Nephrolithiasis    Rx / DC Orders ED Discharge Orders          Ordered    ketorolac (TORADOL) 10 MG tablet  Every 8 hours PRN        11/21/20 1501    tamsulosin (FLOMAX)  0.4 MG CAPS capsule  Daily after supper        11/21/20 1501             Cheryll Cockayne, MD 11/21/20 1501

## 2020-11-21 NOTE — Discharge Instructions (Addendum)
You have a 3 mm kidney stone on the left side.  This is the cause of your pain.  He also have several additional kidney stones on your right side which may 1 day also cause pain.  Call your primary care doctor or specialist as discussed in the next 2-3 days.   Return immediately back to the ER if:  Your symptoms worsen within the next 12-24 hours. You develop new symptoms such as new fevers, persistent vomiting, new pain, shortness of breath, or new weakness or numbness, or if you have any other concerns.

## 2020-11-21 NOTE — ED Provider Notes (Signed)
Emergency Medicine Provider Triage Evaluation Note  Randall Bean , a 45 y.o. male  was evaluated in triage.  Pt complains of left flank pain that started 2 days ago. Feels like prior kidney stones.  Review of Systems  Positive: Flank pain, nv Negative: fever  Physical Exam  BP (!) 169/107 (BP Location: Left Arm)   Pulse 64   Temp 97.9 F (36.6 C) (Oral)   Resp 16   Ht 6\' 1"  (1.854 m)   Wt 108 kg   SpO2 98%   BMI 31.40 kg/m  Gen:   Awake, no distress   Resp:  Normal effort  MSK:   Moves extremities without difficulty   Medical Decision Making  Medically screening exam initiated at 12:31 PM.  Appropriate orders placed.  Randall Bean was informed that the remainder of the evaluation will be completed by another provider, this initial triage assessment does not replace that evaluation, and the importance of remaining in the ED until their evaluation is complete.     11/21/20 1232    11/23/20, MD 11/22/20 [phone]

## 2020-11-21 NOTE — ED Triage Notes (Signed)
Pt reports left sided flank pain for the past 2 days, hx of kidney stones about 1 month ago, states he feels similar, some n/v. Pt seems uncomfortable in triage.

## 2022-05-17 IMAGING — CT CT RENAL STONE PROTOCOL
2 of 4 series · 16 of 46 positions shown, 18 images · non-contrast
Comparison: December 01, 2010

CLINICAL DATA: Flank pain, left-sided, history of renal stones.

EXAM:
CT ABDOMEN AND PELVIS WITHOUT CONTRAST
TECHNIQUE: Multidetector CT imaging of the abdomen and pelvis was performed
following the standard protocol without IV contrast.

[Series 3: renal stone 5.0 · axial · 0.85mm/px · z∈[+841,+1326]mm · 13 of 107 slices shown, 15 images]
[im 5/107  soft-tissue]
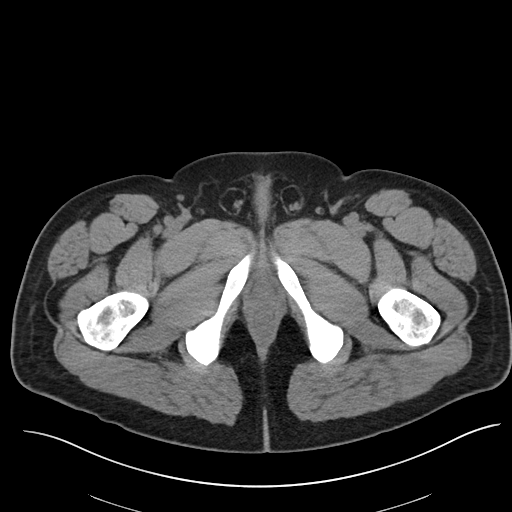
[im 5/107  bone]
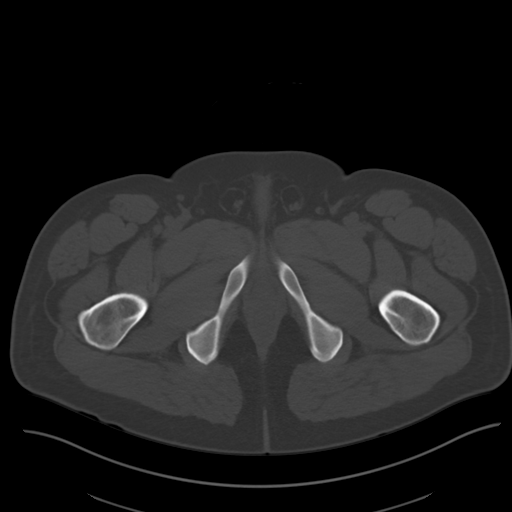
[im 13/107  soft-tissue]
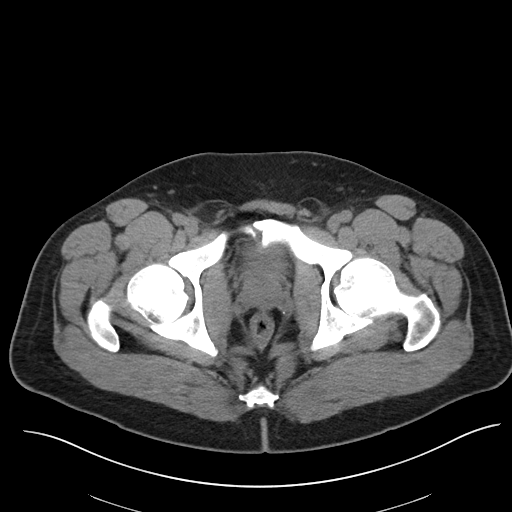
[im 22/107  soft-tissue]
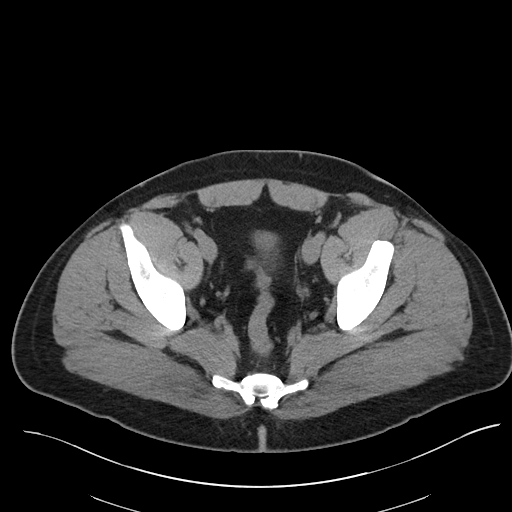
[im 30/107  soft-tissue]
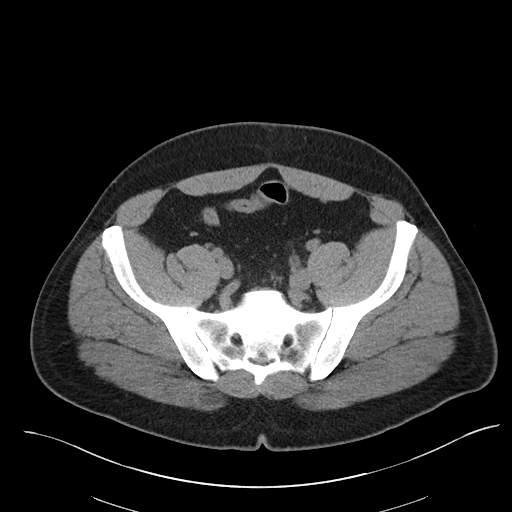
[im 39/107  soft-tissue]
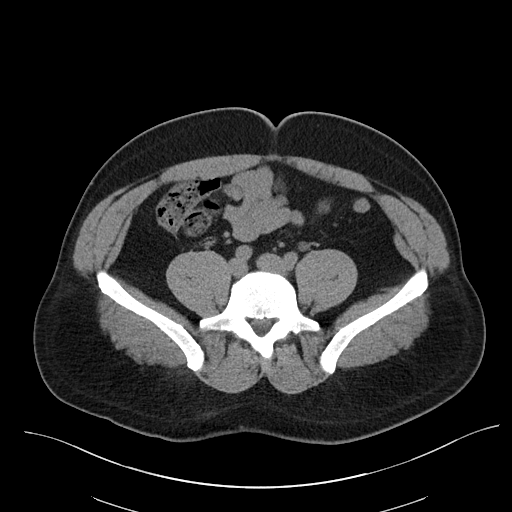
[im 47/107  soft-tissue]
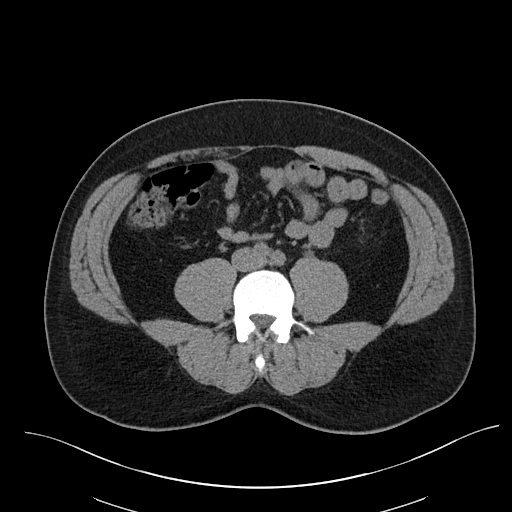
[im 56/107  soft-tissue]
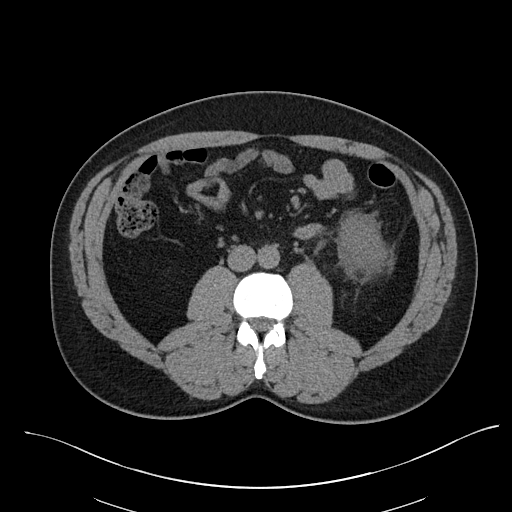
[im 60/107  soft-tissue]
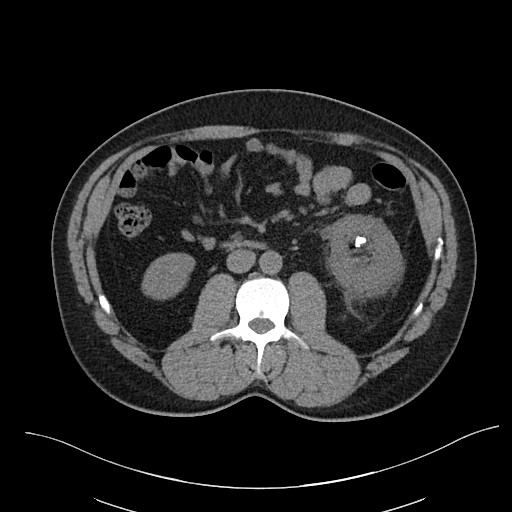
[im 68/107  soft-tissue]
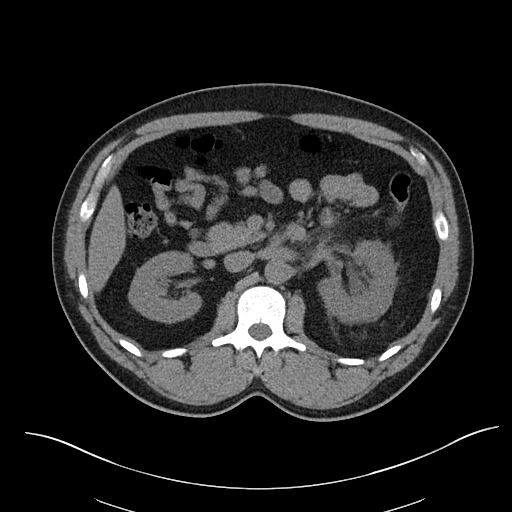
[im 68/107  bone]
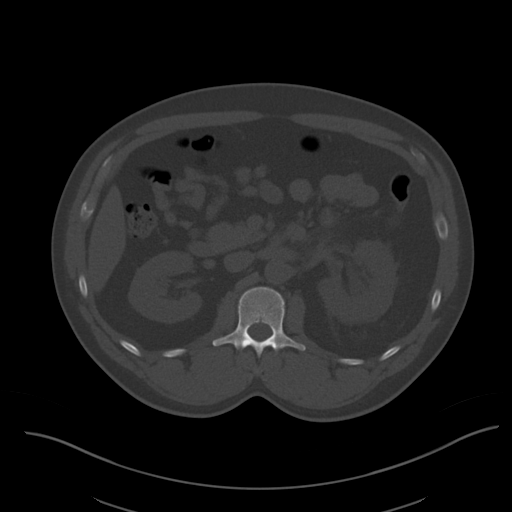
[im 77/107  soft-tissue]
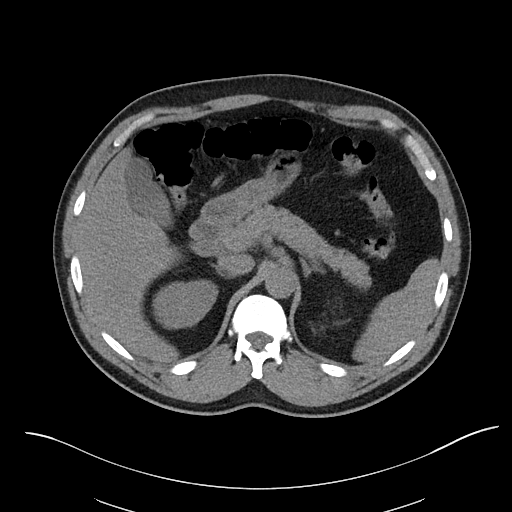
[im 85/107  soft-tissue]
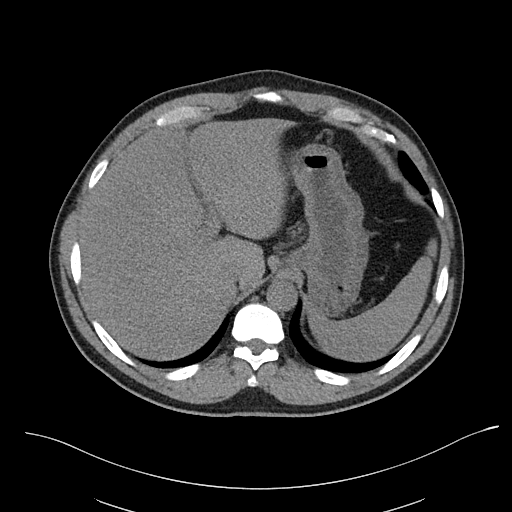
[im 94/107  soft-tissue]
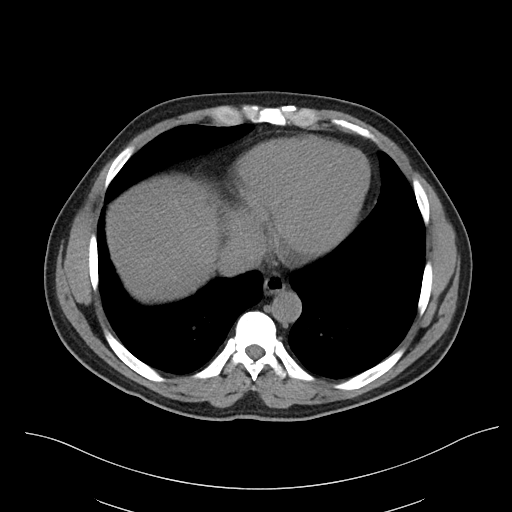
[im 102/107  soft-tissue]
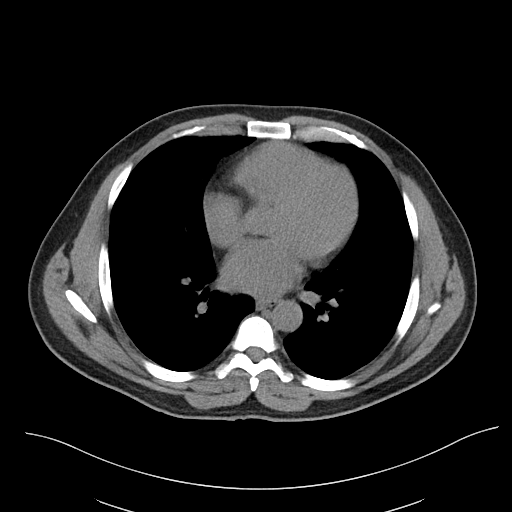

[Series 6: cor · coronal · 0.87mm/px · 3 of 171 slices shown]
[im 57/171  soft-tissue]
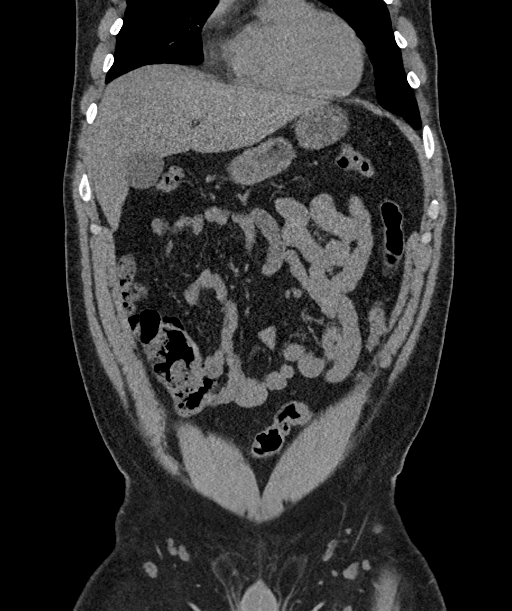
[im 76/171  soft-tissue]
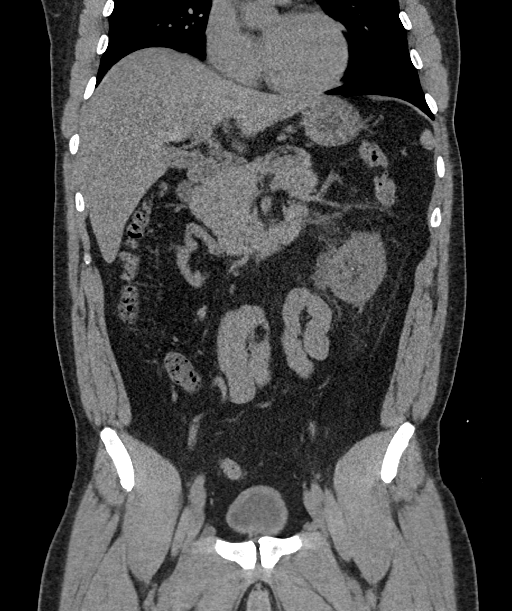
[im 95/171  soft-tissue]
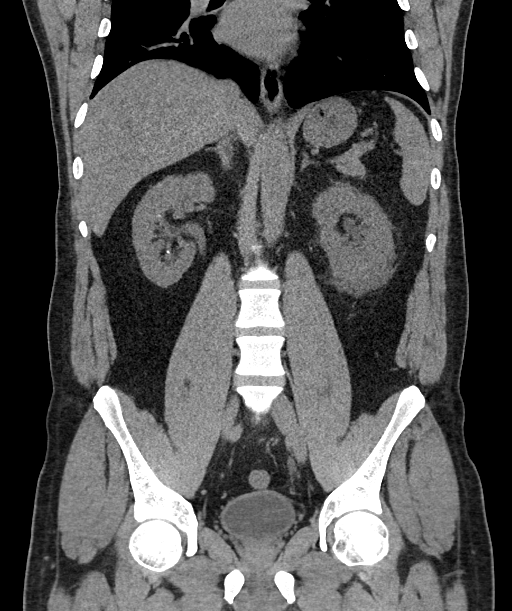

[16 of 46 positions shown; findings below may reference images not displayed]

FINDINGS: Lower chest: No acute abnormality.

Hepatobiliary: Unremarkable noncontrast appearance of the hepatic
parenchyma. Gallbladder is unremarkable. No biliary ductal dilation.

Pancreas: Within normal limits.

Spleen: Within normal limits.

Adrenals/Urinary Tract: Bilateral adrenal glands are unremarkable.
Edematous appearance of the left kidney with perinephric stranding
and mild left hydroureteronephrosis to the level of a 3 mm stone in
the distal ureter on image 89/3. Additional nonobstructive bilateral
renal stones measuring up to 6 mm mm on the left and 4 mm on the
right. No right-sided hydronephrosis.

Urinary bladder is unremarkable for degree of distension.

Stomach/Bowel: Stomach is within normal limits. Appendix appears
normal. No evidence of bowel wall thickening, distention, or
inflammatory changes.

Vascular/Lymphatic: No abdominal aortic aneurysm. No pathologically
enlarged abdominal or pelvic lymph nodes.

Reproductive: Prostate is unremarkable.

Other: No abdominopelvic ascites.

Musculoskeletal: No acute or significant osseous findings.
IMPRESSION: 1. Obstructing 3 mm stone in the distal left ureter with associated
mild hydroureteronephrosis.
2. Additional nonobstructive bilateral renal stones measuring up to
6 mm on the left and 4 mm on the right.
